# Patient Record
Sex: Male | Born: 2000 | Race: White | Hispanic: No | Marital: Single | Smoking: Never smoker
Health system: Southern US, Community
[De-identification: ages and names within clinical notes are randomized; demographics above are authoritative.]

## PROBLEM LIST (undated history)

## (undated) DIAGNOSIS — F909 Attention-deficit hyperactivity disorder, unspecified type: Secondary | ICD-10-CM

## (undated) DIAGNOSIS — R079 Chest pain, unspecified: Secondary | ICD-10-CM

## (undated) DIAGNOSIS — K219 Gastro-esophageal reflux disease without esophagitis: Secondary | ICD-10-CM

## (undated) DIAGNOSIS — J45909 Unspecified asthma, uncomplicated: Secondary | ICD-10-CM

## (undated) HISTORY — PX: TONSILLECTOMY: SUR1361

## (undated) HISTORY — DX: Gastro-esophageal reflux disease without esophagitis: K21.9

## (undated) HISTORY — PX: TYMPANOSTOMY TUBE PLACEMENT: SHX32

## (undated) HISTORY — PX: SURGERY SCROTAL / TESTICULAR: SUR1316

## (undated) HISTORY — DX: Chest pain, unspecified: R07.9

---
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
## 2022-11-23 ENCOUNTER — Other Ambulatory Visit: Payer: Self-pay

## 2022-11-23 ENCOUNTER — Emergency Department (HOSPITAL_COMMUNITY)
Admission: EM | Admit: 2022-11-23 | Discharge: 2022-11-24 | Discharge status: Against Medical Advice | Payer: Medicaid Other | Attending: Emergency Medicine | Admitting: Emergency Medicine

## 2022-11-23 DIAGNOSIS — R109 Unspecified abdominal pain: Secondary | ICD-10-CM | POA: Insufficient documentation

## 2022-11-23 DIAGNOSIS — R112 Nausea with vomiting, unspecified: Secondary | ICD-10-CM | POA: Insufficient documentation

## 2022-11-23 DIAGNOSIS — Z5321 Procedure and treatment not carried out due to patient leaving prior to being seen by health care provider: Secondary | ICD-10-CM | POA: Insufficient documentation

## 2022-11-23 LAB — CBC
HCT: 44 % (ref 39.0–52.0)
Hemoglobin: 14.6 g/dL (ref 13.0–17.0)
MCH: 29.9 pg (ref 26.0–34.0)
MCHC: 33.2 g/dL (ref 30.0–36.0)
MCV: 90 fL (ref 80.0–100.0)
Platelets: 297 10*3/uL (ref 150–400)
RBC: 4.89 MIL/uL (ref 4.22–5.81)
RDW: 12.4 % (ref 11.5–15.5)
WBC: 9 10*3/uL (ref 4.0–10.5)
nRBC: 0 % (ref 0.0–0.2)

## 2022-11-23 LAB — COMPREHENSIVE METABOLIC PANEL
ALT: 22 U/L (ref 0–44)
AST: 24 U/L (ref 15–41)
Albumin: 4.3 g/dL (ref 3.5–5.0)
Alkaline Phosphatase: 104 U/L (ref 38–126)
Anion gap: 10 (ref 5–15)
BUN: 12 mg/dL (ref 6–20)
CO2: 22 mmol/L (ref 22–32)
Calcium: 9.3 mg/dL (ref 8.9–10.3)
Chloride: 106 mmol/L (ref 98–111)
Creatinine, Ser: 1.06 mg/dL (ref 0.61–1.24)
GFR, Estimated: >60 mL/min (ref >60)
Glucose, Bld: 120 mg/dL — ABNORMAL HIGH (ref 70–99)
Potassium: 3.8 mmol/L (ref 3.5–5.1)
Sodium: 138 mmol/L (ref 135–145)
Total Bilirubin: 0.9 mg/dL (ref 0.3–1.2)
Total Protein: 7.3 g/dL (ref 6.5–8.1)

## 2022-11-23 LAB — LIPASE, BLOOD: Lipase: 21 U/L (ref 11–51)

## 2022-11-23 NOTE — ED Notes (Signed)
Pt encouraged to provide a urine sample

## 2022-11-23 NOTE — ED Triage Notes (Addendum)
Pt to ED c/o right side abdominal pain x 3 days, radiating to right side. Progressively getting worse. Reports N/V.

## 2022-11-23 NOTE — ED Notes (Signed)
Pt left ama due to long ED wait times.  

## 2022-11-23 NOTE — ED Notes (Signed)
This RN requesting an abdominal CT and pain medications for pt. Message sent to Dr. Doran Durand, awaiting response

## 2022-11-25 ENCOUNTER — Emergency Department
Admission: EM | Admit: 2022-11-25 | Discharge: 2022-11-25 | Disposition: A | Discharge status: Home / Self Care | Payer: Self-pay | Attending: Emergency Medicine | Admitting: Emergency Medicine

## 2022-11-25 ENCOUNTER — Emergency Department: Payer: Self-pay

## 2022-11-25 ENCOUNTER — Other Ambulatory Visit: Payer: Self-pay

## 2022-11-25 DIAGNOSIS — R509 Fever, unspecified: Secondary | ICD-10-CM | POA: Insufficient documentation

## 2022-11-25 DIAGNOSIS — R112 Nausea with vomiting, unspecified: Secondary | ICD-10-CM | POA: Insufficient documentation

## 2022-11-25 DIAGNOSIS — K0889 Other specified disorders of teeth and supporting structures: Secondary | ICD-10-CM | POA: Insufficient documentation

## 2022-11-25 DIAGNOSIS — R197 Diarrhea, unspecified: Secondary | ICD-10-CM | POA: Insufficient documentation

## 2022-11-25 DIAGNOSIS — R109 Unspecified abdominal pain: Secondary | ICD-10-CM | POA: Insufficient documentation

## 2022-11-25 LAB — LIPASE, BLOOD: Lipase: 27 U/L (ref 11–51)

## 2022-11-25 LAB — COMPREHENSIVE METABOLIC PANEL
ALT: 25 U/L (ref 0–44)
AST: 23 U/L (ref 15–41)
Albumin: 4.3 g/dL (ref 3.5–5.0)
Alkaline Phosphatase: 99 U/L (ref 38–126)
Anion gap: 7 (ref 5–15)
BUN: 18 mg/dL (ref 6–20)
CO2: 25 mmol/L (ref 22–32)
Calcium: 9.2 mg/dL (ref 8.9–10.3)
Chloride: 107 mmol/L (ref 98–111)
Creatinine, Ser: 0.98 mg/dL (ref 0.61–1.24)
GFR, Estimated: >60 mL/min (ref >60)
Glucose, Bld: 101 mg/dL — ABNORMAL HIGH (ref 70–99)
Potassium: 4.7 mmol/L (ref 3.5–5.1)
Sodium: 139 mmol/L (ref 135–145)
Total Bilirubin: 0.8 mg/dL (ref 0.3–1.2)
Total Protein: 7.7 g/dL (ref 6.5–8.1)

## 2022-11-25 LAB — CBC
HCT: 44.1 % (ref 39.0–52.0)
Hemoglobin: 14.9 g/dL (ref 13.0–17.0)
MCH: 29.9 pg (ref 26.0–34.0)
MCHC: 33.8 g/dL (ref 30.0–36.0)
MCV: 88.4 fL (ref 80.0–100.0)
Platelets: 278 10*3/uL (ref 150–400)
RBC: 4.99 MIL/uL (ref 4.22–5.81)
RDW: 12.5 % (ref 11.5–15.5)
WBC: 8.2 10*3/uL (ref 4.0–10.5)
nRBC: 0 % (ref 0.0–0.2)

## 2022-11-25 MED ORDER — SODIUM CHLORIDE 0.9 % IV BOLUS
1000.0000 mL | Freq: Once | INTRAVENOUS | Status: AC
  Administered 2022-11-25: 1000 mL via INTRAVENOUS

## 2022-11-25 MED ORDER — OXYCODONE HCL 5 MG PO TABS
5.0000 mg | ORAL_TABLET | Freq: Once | ORAL | Status: AC
  Administered 2022-11-25: 5 mg via ORAL
  Filled 2022-11-25: qty 1

## 2022-11-25 MED ORDER — OXYCODONE HCL 5 MG PO TABS: 5.0000 mg | ORAL_TABLET | Freq: Three times a day (TID) | ORAL | 0 refills | Status: AC | PRN

## 2022-11-25 MED ORDER — AMOXICILLIN-POT CLAVULANATE 875-125 MG PO TABS: 1.0000 | ORAL_TABLET | Freq: Two times a day (BID) | ORAL | 0 refills | Status: AC

## 2022-11-25 MED ORDER — AMOXICILLIN-POT CLAVULANATE 875-125 MG PO TABS: 1.0000 | ORAL_TABLET | Freq: Two times a day (BID) | ORAL | 0 refills | Status: DC

## 2022-11-25 MED ORDER — OXYCODONE HCL 5 MG PO TABS: 5.0000 mg | ORAL_TABLET | Freq: Three times a day (TID) | ORAL | 0 refills | Status: DC | PRN

## 2022-11-25 MED ORDER — ONDANSETRON HCL 4 MG/2ML IJ SOLN
4.0000 mg | Freq: Once | INTRAMUSCULAR | Status: AC
  Administered 2022-11-25: 4 mg via INTRAVENOUS
  Filled 2022-11-25: qty 2

## 2022-11-25 MED ORDER — KETOROLAC TROMETHAMINE 15 MG/ML IJ SOLN
15.0000 mg | Freq: Once | INTRAMUSCULAR | Status: AC
  Administered 2022-11-25: 15 mg via INTRAVENOUS
  Filled 2022-11-25: qty 1

## 2022-11-25 MED ORDER — AMOXICILLIN-POT CLAVULANATE 875-125 MG PO TABS
1.0000 | ORAL_TABLET | Freq: Once | ORAL | Status: AC
  Administered 2022-11-25: 1 via ORAL
  Filled 2022-11-25: qty 1

## 2022-11-25 MED ORDER — ONDANSETRON HCL 4 MG PO TABS: 4.0000 mg | ORAL_TABLET | Freq: Every day | ORAL | 1 refills | Status: AC | PRN

## 2022-11-25 MED ORDER — IOHEXOL 300 MG/ML  SOLN
100.0000 mL | Freq: Once | INTRAMUSCULAR | Status: AC | PRN
  Administered 2022-11-25: 100 mL via INTRAVENOUS

## 2022-11-25 MED ORDER — ONDANSETRON HCL 4 MG PO TABS: 4.0000 mg | ORAL_TABLET | Freq: Every day | ORAL | 1 refills | Status: DC | PRN

## 2022-11-25 NOTE — ED Triage Notes (Signed)
Pt c/o right sided upper dental pain, facial swelling, rash x 2 days.  Now is having abdominal pain & vomiting. Denies injury

## 2022-11-25 NOTE — Discharge Instructions (Addendum)
Take antibiotic for your dental pain as prescribed. Call dentist for appointment this week.  Take acetaminophen 650 mg and ibuprofen 400 mg every 6 hours for pain.  Take with food. Take oxycodone for severe/breakthrough pain only, as prescribed.   Use zofran for nausea as needed.   Thank you for choosing [place] for your health care today!  Please see your primary doctor this week for a follow up appointment.   If you have any new, worsening, or unexpected symptoms call your doctor right away or come back to the emergency department for reevaluation.  It was my pleasure to care for you today.   Daneil Dan Modesto Charon, MD

## 2022-11-25 NOTE — ED Provider Notes (Signed)
Care of this patient assumed from prior physician at 0700 pending CT, reevaluation, and disposition. Please see prior physician note for further details.  Briefly this is a 22 year old male who presented with dental pain and abdominal pain.  Dental exam was thought to be early dental infection in the setting of poor dental hygiene.Time of signout.  For abdominal pain, labs were obtained which was reassuring.  CT was pending. This returned without evidence of appendicitis or other acute intra-abdominal pathology.  Patient reevaluated and updated on the results of his workup.  Prescription sent by prior provider, but resent to patient's preferred pharmacy.  Strict return precautions provided.  Patient discharged in stable condition.   Trinna Post, MD 11/25/22 [phone]

## 2022-11-25 NOTE — ED Provider Notes (Addendum)
[facility] Provider Note    Event Date/Time   First MD Initiated Contact with Patient 11/25/22 [phone]     (approximate)   History   Dental Pain and Abdominal Pain   HPI  Bryan Yates is a 22 y.o. male   Past medical history of no significant past medical history presents emergency department with multiple complaints.  He has had intermittent upper right-sided tooth pain throughout the years, usually better with analgesics, but worsening over the last several days, noted some facial swelling as well.  Subjective fever and chills.  No dental trauma.  No difficulty with swallowing or maintaining secretions or respirations.  He has also had 2 days of right side abdominal pain, nonradiating.  No testicular pain, no dysuria or frequency.  No history of kidney stones.  No back pain.  He has had some loose stools, some nausea and vomiting as well.  No known sick contacts.  Independent Historian contributed to assessment above: His girlfriend and mother at bedside corroborate information past medical history as above.    Physical Exam   Triage Vital Signs: ED Triage Vitals  Encounter Vitals Group     BP 11/25/22 0404 (!) 143/92     Systolic BP Percentile --      Diastolic BP Percentile --      Pulse Rate 11/25/22 0404 86     Resp 11/25/22 0404 18     Temp 11/25/22 0404 98.6 F (37 C)     Temp Source 11/25/22 0404 Oral     SpO2 11/25/22 0404 97 %     Weight 11/25/22 0405 210 lb (95.3 kg)     Height 11/25/22 0405 6\' 1"  (1.854 m)     Head Circumference --      Peak Flow --      Pain Score 11/25/22 0404 7     Pain Loc --      Pain Education --      Exclude from Growth Chart --     Most recent vital signs: Vitals:   11/25/22 0404 11/25/22 0405  BP: (!) 143/92   Pulse: 86   Resp: 18   Temp: 98.6 F (37 C)   SpO2: 97% 98%    General: Awake, no distress.  CV:  Good peripheral perfusion.  Resp:  Normal effort.  Abd:  No distention.   Other:  Awake alert comfortable hypertensive otherwise vital signs normal, afebrile, right-sided upper and lower quadrant tenderness to palpation without rigidity or guarding.  No CVA tenderness.  Dental caries throughout, no obvious abscess today no significant facial swelling or neck swelling noted.  Neck supple full range of motion, nontoxic appearance.   ED Results / Procedures / Treatments   Labs (all labs ordered are listed, but only abnormal results are displayed) Labs Reviewed  COMPREHENSIVE METABOLIC PANEL - Abnormal; Notable for the following components:      Result Value   Glucose, Bld 101 (*)    All other components within normal limits  LIPASE, BLOOD  CBC  URINALYSIS, ROUTINE W REFLEX MICROSCOPIC     I ordered and reviewed the above labs they are notable for LFTs and white blood cell normal   PROCEDURES:  Critical Care performed: No  Procedures   MEDICATIONS ORDERED IN ED: Medications  sodium chloride 0.9 % bolus 1,000 mL (has no administration in time range)  ondansetron (ZOFRAN) injection 4 mg (has no administration in time range)  oxyCODONE (Oxy IR/ROXICODONE) immediate release tablet 5  mg (has no administration in time range)  ketorolac (TORADOL) 15 MG/ML injection 15 mg (has no administration in time range)  amoxicillin-clavulanate (AUGMENTIN) 875-125 MG per tablet 1 tablet (has no administration in time range)    IMPRESSION / MDM / ASSESSMENT AND PLAN / ED COURSE  I reviewed the triage vital signs and the nursing notes.                                Patient's presentation is most consistent with acute presentation with potential threat to life or bodily function.  Differential diagnosis includes, but is not limited to, dental pain, dental infection, facial abscess, appendicitis, cholecystitis, viral gastroenteritis   MDM:    In terms of his tooth pain, perhaps early dental infection with poor dental hygiene dental caries, subjective fever and  chills.  Will start Augmentin, pain control in the emergency department, he will need to see dentist.  No signs of airway obstruction, Ludwick's, sepsis.  No drainable abscess on my clinical exam.  In terms of his abdominal pain, nausea vomiting diarrhea over the last several days, likely viral gastroenteritis however given right-sided tenderness to palpation check CT scan for intra-abdominal infection or surgical process like cholecystitis or appendicitis. Doubt torsion or uti given no testicular pain or urinary sx.         FINAL CLINICAL IMPRESSION(S) / ED DIAGNOSES   Final diagnoses:  Pain, dental  Right sided abdominal pain  Nausea vomiting and diarrhea     Rx / DC Orders   ED Discharge Orders          Ordered    amoxicillin-clavulanate (AUGMENTIN) 875-125 MG tablet  2 times daily        11/25/22 0635    ondansetron (ZOFRAN) 4 MG tablet  Daily PRN        11/25/22 0635    oxyCODONE (ROXICODONE) 5 MG immediate release tablet  Every 8 hours PRN        11/25/22 9562             Note:  This document was prepared using Dragon voice recognition software and may include unintentional dictation errors.    Pilar Jarvis, MD 11/25/22 1308    Pilar Jarvis, MD 11/25/22 [phone]
